# Patient Record
Sex: Male | Born: 1990 | Race: Black or African American | Hispanic: No | Marital: Single | Smoking: Current every day smoker
Health system: Southern US, Community
[De-identification: ages and names within clinical notes are randomized; demographics above are authoritative.]

## PROBLEM LIST (undated history)

## (undated) DIAGNOSIS — Z789 Other specified health status: Secondary | ICD-10-CM

## (undated) HISTORY — PX: NO PAST SURGERIES: SHX2092

## (undated) HISTORY — DX: Other specified health status: Z78.9

---

## 2019-12-13 ENCOUNTER — Encounter: Payer: Self-pay | Admitting: Nurse Practitioner

## 2020-01-08 ENCOUNTER — Other Ambulatory Visit (INDEPENDENT_AMBULATORY_CARE_PROVIDER_SITE_OTHER): Payer: BC Managed Care – PPO

## 2020-01-08 ENCOUNTER — Other Ambulatory Visit: Payer: Self-pay

## 2020-01-08 ENCOUNTER — Encounter: Payer: Self-pay | Admitting: Nurse Practitioner

## 2020-01-08 ENCOUNTER — Ambulatory Visit (INDEPENDENT_AMBULATORY_CARE_PROVIDER_SITE_OTHER)
Admission: RE | Admit: 2020-01-08 | Discharge: 2020-01-08 | Disposition: A | Discharge status: Home / Self Care | Payer: BC Managed Care – PPO | Source: Ambulatory Visit | Attending: Nurse Practitioner | Admitting: Nurse Practitioner

## 2020-01-08 ENCOUNTER — Ambulatory Visit (INDEPENDENT_AMBULATORY_CARE_PROVIDER_SITE_OTHER): Payer: BC Managed Care – PPO | Admitting: Nurse Practitioner

## 2020-01-08 ENCOUNTER — Other Ambulatory Visit: Payer: Self-pay | Admitting: Gastroenterology

## 2020-01-08 VITALS — BP 100/60 | HR 60 | Ht 69.75 in | Wt 159.1 lb

## 2020-01-08 DIAGNOSIS — M940 Chondrocostal junction syndrome [Tietze]: Secondary | ICD-10-CM | POA: Diagnosis not present

## 2020-01-08 DIAGNOSIS — S20211A Contusion of right front wall of thorax, initial encounter: Secondary | ICD-10-CM

## 2020-01-08 DIAGNOSIS — S20219A Contusion of unspecified front wall of thorax, initial encounter: Secondary | ICD-10-CM

## 2020-01-08 DIAGNOSIS — T148XXA Other injury of unspecified body region, initial encounter: Secondary | ICD-10-CM | POA: Insufficient documentation

## 2020-01-08 LAB — COMPREHENSIVE METABOLIC PANEL
ALT: 22 U/L (ref 0–53)
AST: 22 U/L (ref 0–37)
Albumin: 4.5 g/dL (ref 3.5–5.2)
Alkaline Phosphatase: 41 U/L (ref 39–117)
BUN: 13 mg/dL (ref 6–23)
CO2: 30 mEq/L (ref 19–32)
Calcium: 9.6 mg/dL (ref 8.4–10.5)
Chloride: 103 mEq/L (ref 96–112)
Creatinine, Ser: 1.02 mg/dL (ref 0.40–1.50)
GFR: 102.89 mL/min (ref >60.00)
Glucose, Bld: 87 mg/dL (ref 70–99)
Potassium: 4.1 mEq/L (ref 3.5–5.1)
Sodium: 137 mEq/L (ref 135–145)
Total Bilirubin: 0.5 mg/dL (ref 0.2–1.2)
Total Protein: 7 g/dL (ref 6.0–8.3)

## 2020-01-08 LAB — CBC WITH DIFFERENTIAL/PLATELET
Basophils Absolute: 0 10*3/uL (ref 0.0–0.1)
Basophils Relative: 0.8 % (ref 0.0–3.0)
Eosinophils Absolute: 0.3 10*3/uL (ref 0.0–0.7)
Eosinophils Relative: 5.3 % — ABNORMAL HIGH (ref 0.0–5.0)
HCT: 43.6 % (ref 39.0–52.0)
Hemoglobin: 14.8 g/dL (ref 13.0–17.0)
Lymphocytes Relative: 28.4 % (ref 12.0–46.0)
Lymphs Abs: 1.7 10*3/uL (ref 0.7–4.0)
MCHC: 33.9 g/dL (ref 30.0–36.0)
MCV: 84.8 fl (ref 78.0–100.0)
Monocytes Absolute: 0.5 10*3/uL (ref 0.1–1.0)
Monocytes Relative: 8.7 % (ref 3.0–12.0)
Neutro Abs: 3.4 10*3/uL (ref 1.4–7.7)
Neutrophils Relative %: 56.8 % (ref 43.0–77.0)
Platelets: 161 10*3/uL (ref 150.0–400.0)
RBC: 5.15 Mil/uL (ref 4.22–5.81)
RDW: 13.7 % (ref 11.5–15.5)
WBC: 6.1 10*3/uL (ref 4.0–10.5)

## 2020-01-08 NOTE — Progress Notes (Signed)
Reviewed and agree with management plan. Further evaluation and mgmt of his injury with his PCP and orthopedics.   Venita Lick. Russella Dar, MD [facility]

## 2020-01-08 NOTE — Progress Notes (Signed)
01/08/2020 Carlas Vandyne [id number] 1991/03/31   CHIEF COMPLAINT:  Possible hernia   HISTORY OF PRESENT ILLNESS:  Tannar Broker is a 29 year old male without any significant past medical history. He presents today for further evaluation regarding chest wall pain. He is concerned he might have an abdominal hernia. No past surgical history. He works for [employer]. 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 He inadvertently leaned forward over the conveyer belt and his shirt and upper abdomen got caught in the conveyer belt. He experienced immediate upper abdominal burning pain. He developed described having a severe "rug burn" to his lower chest and below his rib cage which leaked serous type fluid, no exudate. He kept the lower chest and upper abdominal wounds clean, he applied otc topical antibiotic ointment and a dry dressing which he changed daily. He did not seek medical attention. He did not present to the ER or urgent care. His 2 abrasion type wounds have mostly healed, no open wounds at this time. However, he feels a small know to the left chest area and he has some pain to the lower rib cage bilaterally. No mid chest pain. No SOB. No N/V. No heartburn. No lower abdominal pain. He is passing a normal BM daily. Maybe lost a few pounds. No fever, sweats or chills. He also complains of having right wrist for which he is taking Ibuprofen 400 - 600mg  once or twice weekly for the past 2 weeks. No prescription medications.    Social History: He smokes a few cigars on weekends. Infrequent marijuana use. He drinks a few mimosas or beers on the weekends.   Family History:  Materna grandmother diagnosed with colon cancer in her 73's. Mother is age 64 with ? history of colon polyps. Father unknown. Brothers x 3 all healthy.  Maternal uncle with DM. No Known Allergies     No outpatient encounter medications on file as of 01/08/2020.   No  facility-administered encounter medications on file as of 01/08/2020.    REVIEW OF SYSTEMS:  Gen: Denies fever, sweats or chills. Possibly lost a few pounds. CV: Denies chest pain, palpitations or edema. Resp: Denies cough, shortness of breath of hemoptysis.  GI: See HPI.  GU : Denies urinary burning, blood in urine, increased urinary frequency or incontinence. MS: Denies joint pain, muscles aches or weakness. Derm: Denies rash, itchiness, skin lesions or unhealing ulcers. Psych: Denies depression, anxiety, memory loss, suicidal ideation and confusion. Heme: Denies bruising, bleeding. Neuro:  Denies headaches, dizziness or paresthesias. Endo:  Denies any problems with DM, thyroid or adrenal function.    PHYSICAL EXAM: BP 100/60 (BP Location: Left Arm, Patient Position: Sitting, Cuff Size: Normal)   Pulse 60   Ht 5' 9.75" (1.772 m) Comment: height measured without shoes  Wt 159 lb 2 oz (72.2 kg)   BMI 23.00 kg/m  General: Well developed  29 year old male in no acute distress. Head: Normocephalic and atraumatic. Eyes:  Sclerae non-icteric, conjunctive pink. Ears: Normal auditory acuity. Mouth: Dentition intact. No ulcers or lesions.  Neck: Supple, no lymphadenopathy or thyromegaly.  Lungs: Clear bilaterally to auscultation without wheezes, crackles or rhonchi. Heart: Regular rate and rhythm. No murmur, rub or gallop appreciated.  Chest: Contusions to the right and left lower costal margin with swelling to the left central costal area > right lower costal margin. A healing superficial abrasion to the lower chest measures approximately  8cm in length x 2 cm in width which is parallel to the upper abdominal abrasion as documented below. Tiny superficial knot left lower chest adjacent to the sternum.  Abdomen: Healing superficial abrasion below the costal margin extends across the upper abdomen approximately 10cm in length x 1.5 cm in width. No exudate. Soft, nontender, non distended. No  masses. No hepatosplenomegaly. Normoactive bowel sounds x 4 quadrants. No hernia.  Rectal: Deferred. Musculoskeletal: Symmetrical with no gross deformities. Skin: Warm and dry. No rash or lesions on visible extremities. Extremities: No edema. Neurological: Alert oriented x 4, no focal deficits.  Psychological:  Alert and cooperative. Normal mood and affect.  ASSESSMENT AND PLAN:  37. 29 year old male with a lower chest and upper abdominal/subcostal abrasions with bilateral lower costal contusions secondary to a work related injury as documented above. No abdominal hernia assessed.  -Advised patient to follow up with his PCP, he has established a new PCP [facility] [employer], has not yet been seen.  -Chest xray today -CBC, CMP -Recommended ortho referral for right wrist pain  2. Family history of colon polyps and colon cancer -Screening colonoscopy age 3 or earlier if symptoms warrant     CC:  No ref. provider found

## 2020-01-08 NOTE — Patient Instructions (Signed)
If you are age 31 or older, your body mass index should be between 23-30. Your Body mass index is 23 kg/m. If this is out of the aforementioned range listed, please consider follow up with your Primary Care Provider.  If you are age 69 or younger, your body mass index should be between 19-25. Your Body mass index is 23 kg/m. If this is out of the aformentioned range listed, please consider follow up with your Primary Care Provider.   1. Schedule an appointment with a primary care physician. 2 We will refer you to Orthopedics for right wrist pain and bilateral costal margin contusion. 3. Take ibuprofen 400 mg daily for 2 weeks  Please head to radiology on the basement floor of our building for a Chest xray.  Due to recent changes in healthcare laws, you may see the results of your imaging and laboratory studies on MyChart before your provider has had a chance to review them.  We understand that in some cases there may be results that are confusing or concerning to you. Not all laboratory results come back in the same time frame and the provider may be waiting for multiple results in order to interpret others.  Please give [place] 48 hours in order for your provider to thoroughly review all the results before contacting the office for clarification of your results.   Thank you for choosing [facility] Arnaldo Natal, CRNP

## 2022-01-11 IMAGING — DX DG CHEST 2V
2 series · 2 of 2 positions shown · non-contrast
Comparison: None.

CLINICAL DATA: Rib cage injury 1 month ago, swelling and pain

EXAM:
CHEST - 2 VIEW

[chest pa]
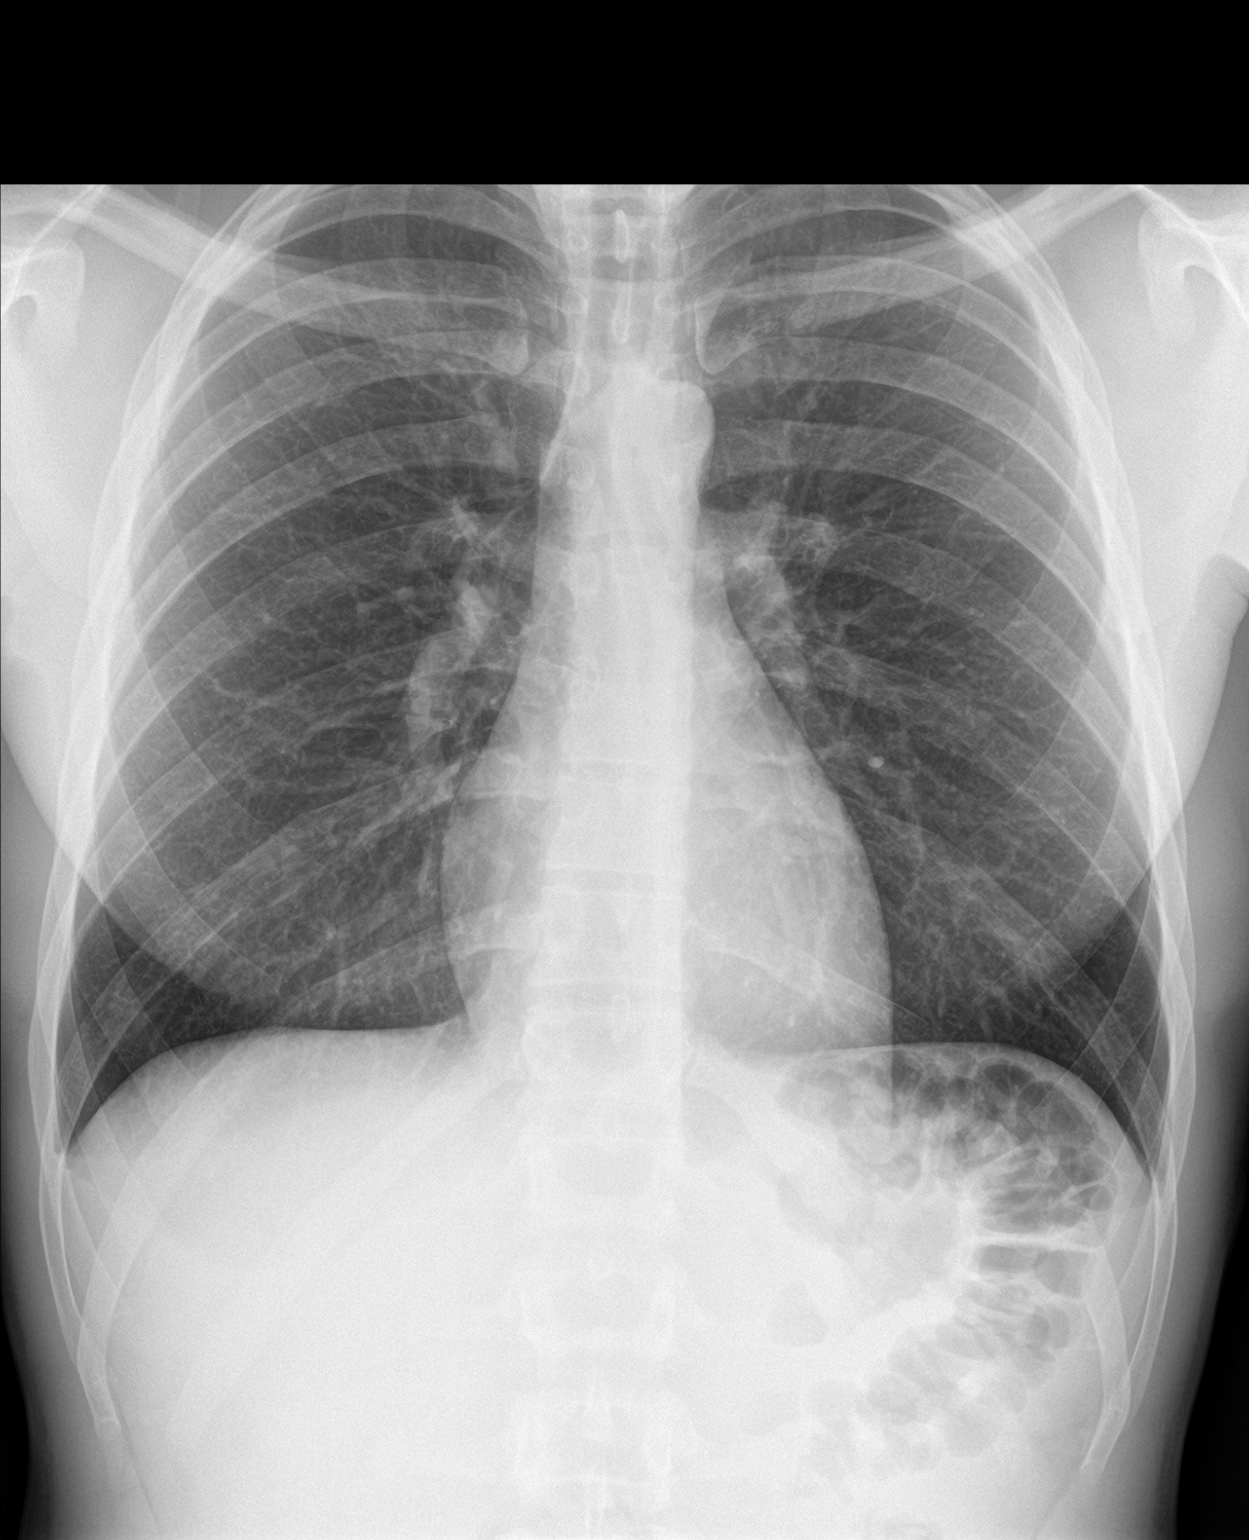

[chest lat]
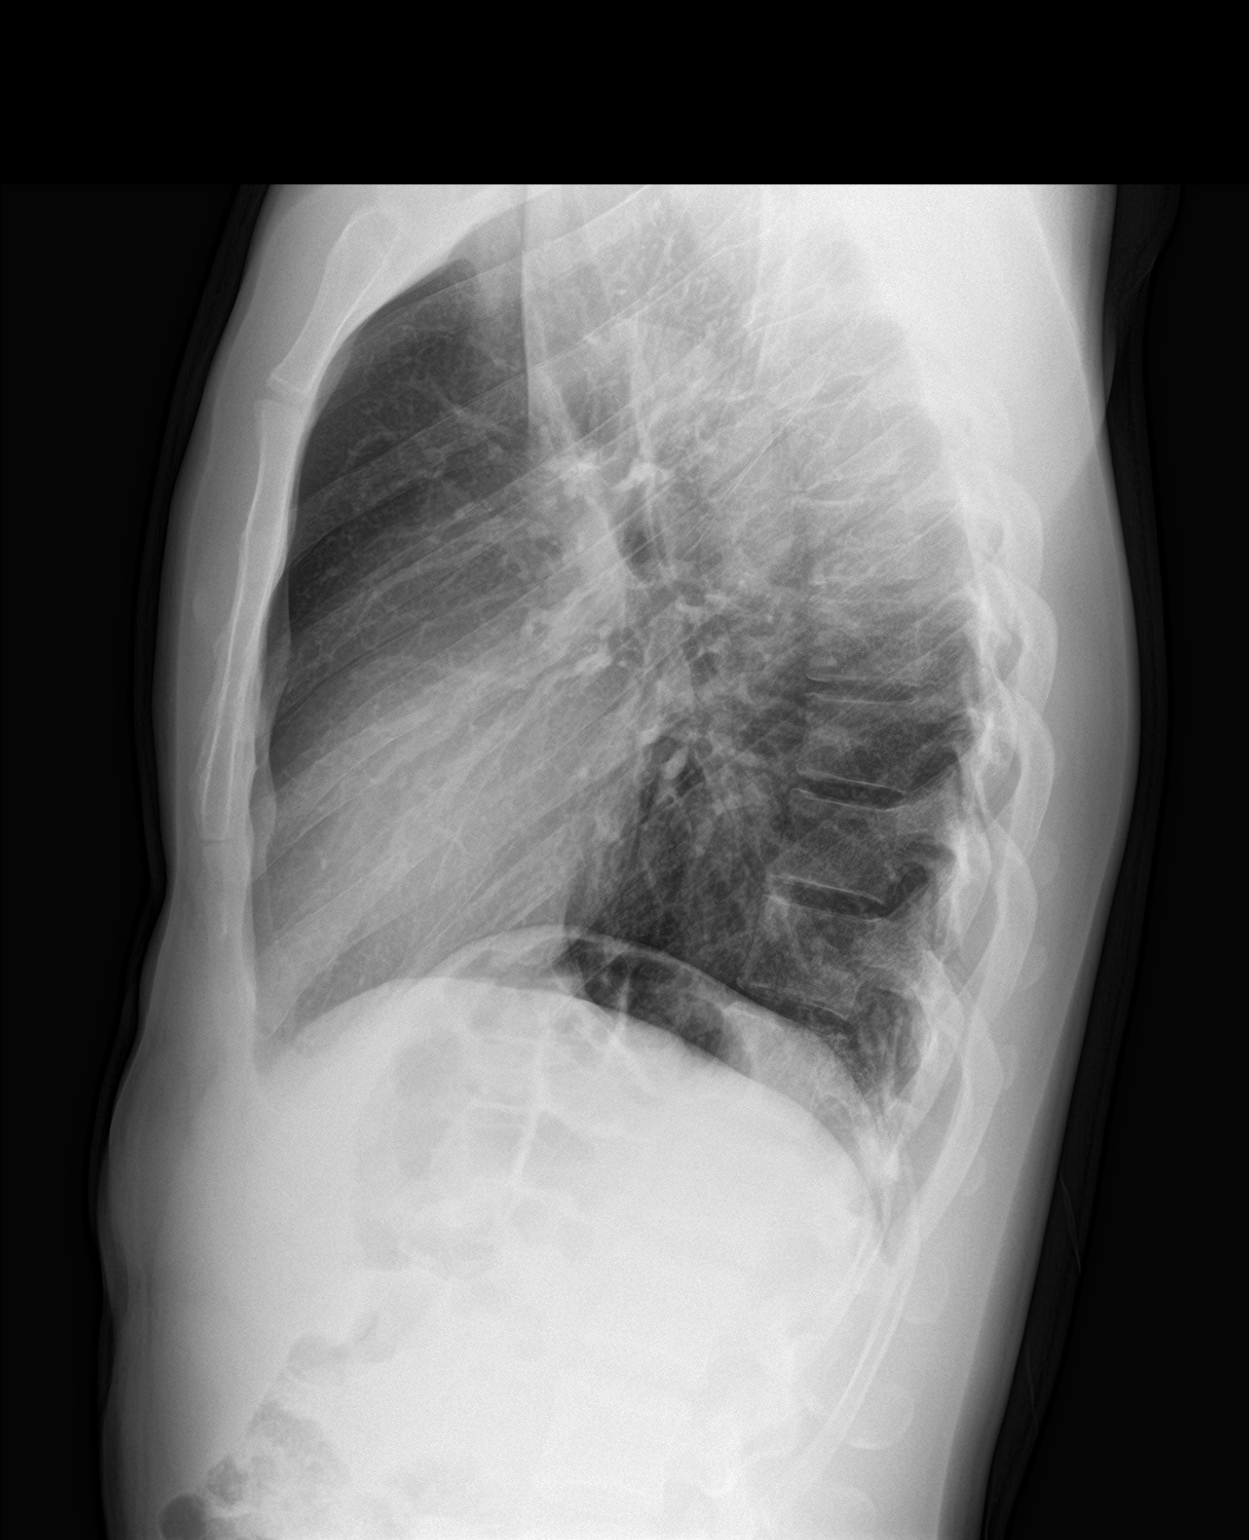

[2 of 2 positions shown; findings below may reference images not displayed]

FINDINGS: The heart size and mediastinal contours are within normal limits.
Both lungs are clear. The visualized skeletal structures are
unremarkable.
IMPRESSION: No acute abnormality of the lungs. No obvious displaced fracture of
the ribs on chest radiograph.
